# Patient Record
Sex: Female | Born: 1990 | Race: White | Hispanic: No | Marital: Married | Smoking: Never smoker
Health system: Southern US, Community
[De-identification: ages and names within clinical notes are randomized; demographics above are authoritative.]

## PROBLEM LIST (undated history)

## (undated) DIAGNOSIS — F32A Depression, unspecified: Secondary | ICD-10-CM

## (undated) DIAGNOSIS — G90529 Complex regional pain syndrome I of unspecified lower limb: Secondary | ICD-10-CM

## (undated) DIAGNOSIS — F419 Anxiety disorder, unspecified: Secondary | ICD-10-CM

## (undated) DIAGNOSIS — F329 Major depressive disorder, single episode, unspecified: Secondary | ICD-10-CM

## (undated) HISTORY — PX: ABDOMINAL HYSTERECTOMY: SHX81

---
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
## 2020-03-25 ENCOUNTER — Encounter: Payer: Self-pay | Admitting: Emergency Medicine

## 2020-03-25 ENCOUNTER — Other Ambulatory Visit: Payer: Self-pay

## 2020-03-25 ENCOUNTER — Emergency Department (INDEPENDENT_AMBULATORY_CARE_PROVIDER_SITE_OTHER)
Admission: EM | Admit: 2020-03-25 | Discharge: 2020-03-25 | Disposition: A | Discharge status: Home / Self Care | Payer: 59 | Source: Home / Self Care | Attending: Emergency Medicine | Admitting: Emergency Medicine

## 2020-03-25 DIAGNOSIS — J40 Bronchitis, not specified as acute or chronic: Secondary | ICD-10-CM | POA: Diagnosis not present

## 2020-03-25 DIAGNOSIS — J301 Allergic rhinitis due to pollen: Secondary | ICD-10-CM

## 2020-03-25 MED ORDER — AZITHROMYCIN 250 MG PO TABS: ORAL_TABLET | ORAL | 0 refills | Status: AC

## 2020-03-25 MED ORDER — FLUTICASONE PROPIONATE 50 MCG/ACT NA SUSP: NASAL | 0 refills | Status: AC

## 2020-03-25 MED ORDER — PROMETHAZINE-DM 6.25-15 MG/5ML PO SYRP: ORAL_SOLUTION | ORAL | 0 refills | Status: AC

## 2020-03-25 NOTE — ED Triage Notes (Signed)
Patient c/o deep cough, chest congestion, drainage is clear, sore throat x 2 weeks.  Tested for COVID last week, negative, "feels like crap."

## 2020-03-25 NOTE — ED Provider Notes (Signed)
[facility]    CSN: [id number] Arrival date & time: 03/25/20  1107      History   Chief Complaint Chief Complaint  Patient presents with  . URI    HPI Lauren Peck is a 29 y.o. female.   HPI Acute nasal and URI symptoms first started 13 days ago.  Started with allergy symptoms associated with high pollen count and she was very nearby others who were cutting grass.  Started with sinus congestion, clear rhinorrhea, eyes itching, sneezing.  Minimal scratchy throat but no sore throat and the scratchy throat has since resolved.  Then developed mild fever to 100 with chills minimal sweats.  She went to a different urgent care on 4/2, where Covid test was negative.  Urine culture from that visit was ultimately positive, and she was prescribed Macrodantin 2 days ago, and she will be filling that prescription today. Over past several days, cough has worsened, productive of yellow sputum at times. Still has some sinus congestion and left ear feels clogged now. Currently denies any fever.  Denies chest pain or shortness of breath or abdominal pain or nausea or vomiting or change of bowel habits.  Past medical history:  She has seasonal allergies every spring.  No history of chronic lung disease or asthma.   She has chronic pelvic pain, seeing a specialist for this.  Specialist is prescribing vaginal diazepam.  Also on gabapentin and bupropion. No LMP recorded. Patient has had a hysterectomy.   History reviewed. No pertinent surgical history.  Except hysterectomy  OB History   No obstetric history on file.      Home Medications    Prior to Admission medications   Medication Sig Start Date End Date Taking? Authorizing Provider  acetaZOLAMIDE (DIAMOX) 250 MG tablet  03/17/20  Yes [provider]  amitriptyline (ELAVIL) 10 MG tablet Take 10 mg by mouth at bedtime. 03/15/20  Yes [provider]  BELBUCA 300 MCG FILM Take 1 strip by mouth 2 (two) times daily.  02/09/20  Yes [provider]  buPROPion (WELLBUTRIN XL) 150 MG 24 hr tablet TK 1 T PO  D 02/17/17  Yes [provider]  diazepam (VALIUM) 5 MG tablet Take 5 mg by mouth daily as needed. 11/18/19  Yes [provider]  diphenhydrAMINE (SOMINEX) 25 MG tablet Take by mouth.   Yes [provider]  Docusate Sodium (DSS) 100 MG CAPS Take by mouth.   Yes [provider]  DULoxetine (CYMBALTA) 30 MG capsule Take by mouth. 02/22/19  Yes [provider]  Estradiol-Progesterone (BIJUVA) 1-100 MG CAPS Take by mouth. 09/14/18  Yes [provider]  gabapentin (NEURONTIN) 300 MG capsule Take by mouth. 11/18/18  Yes [provider]  linaclotide (LINZESS) 145 MCG CAPS capsule Take by mouth.   Yes [provider]  meclizine (ANTIVERT) 25 MG tablet Take 25 mg by mouth 3 (three) times daily as needed. 03/07/20  Yes [provider]  Meth-Hyo-M Bl-Na Phos-Ph Sal (URIBEL) 118 MG CAPS Take by mouth. 02/09/19  Yes [provider]  nitrofurantoin, macrocrystal-monohydrate, (MACROBID) 100 MG capsule Take 100 mg by mouth 2 (two) times daily. 03/21/20  Yes [provider]  promethazine (PHENERGAN) 25 MG tablet Take by mouth. 11/21/16  Yes [provider]  tiZANidine (ZANAFLEX) 4 MG capsule Take by mouth. 06/15/19  Yes [provider]  traZODone (DESYREL) 50 MG tablet TK 1 TO 2 TS PO HS PRF INSOMNIA 12/25/16  Yes [provider]  azithromycin (ZITHROMAX Z-PAK) 250 MG tablet Take 2 tablets on day one, then 1 tablet daily on days 2 through 5 03/25/20   Lajean Manes, MD  fluticasone [facility]) 50 MCG/ACT nasal spray 1 or 2 sprays each nostril twice a day 03/25/20   Lajean Manes, MD  promethazine-dextromethorphan (PROMETHAZINE-DM) 6.25-15 MG/5ML syrup 1 or 2 teaspoon po every 4-6 hours as needed for cough.-May cause drowsiness 03/25/20   Lajean Manes, MD    Family History No family history on file.  Social  History Social History   Tobacco Use  . Smoking status: Never Smoker  . Smokeless tobacco: Never Used  Substance Use Topics  . Alcohol use: Not on file  . Drug use: Not on file     Allergies   Ciprofloxacin   Review of Systems Review of Systems  All other systems reviewed and are negative.    Physical Exam Triage Vital Signs ED Triage Vitals  Enc Vitals Group     BP 03/25/20 1134 (!) 144/92     Pulse Rate 03/25/20 1134 98     Resp --      Temp 03/25/20 1134 99 F (37.2 C)     Temp Source 03/25/20 1134 Oral     SpO2 03/25/20 1134 97 %     Weight 03/25/20 1135 251 lb 8 oz (114.1 kg)     Height 03/25/20 1135 5\' 7"  (1.702 m)     Head Circumference --      Peak Flow --      Pain Score 03/25/20 1135 0     Pain Loc --      Pain Edu? --      Excl. in GC? --    No data found.  Updated Vital Signs BP (!) 144/92 (BP Location: Left Arm)   Pulse 98   Temp 99 F (37.2 C) (Oral)   Ht 5\' 7"  (1.702 m)   Wt 114.1 kg   SpO2 97%   BMI 39.39 kg/m    Physical Exam Vitals and nursing note reviewed.  Constitutional:      General: She is not in acute distress.    Appearance: She is well-developed.     Comments: Cough noted  HENT:     Right Ear: External ear normal.  TM normal.    Left Ear: External ear normal.  TM normal.   Nose: Boggy turbinates, mild seromucoid drainage bilaterally.  No maxillary sinus tenderness.     Mouth/Throat: Oropharynx within normal limits except cobblestoning posterior pharynx.    Pharynx: No uvula swelling.  Airway intact. Eyes:     General: No scleral icterus.    Conjunctiva/sclera:     Right eye: Right conjunctiva normal.  No discharge.    Left eye: Left conjunctiva is normal.  No discharge.    Pupils: Pupils are equal, round, and reactive to light.  Neck:     Supple, no adenopathy.  No JVD.     Trachea: No tracheal deviation.  Cardiovascular:     Rate and Rhythm: Normal rate and regular rhythm.  Pulmonary:     Effort: No  respiratory distress.     Breath sounds: No stridor.  Diffuse rhonchi heard bilaterally.  Good air movement.  No wheezing.  No rales.  Abdominal:   There is no distension.   Lymphadenopathy:     Cervical: No cervical adenopathy.  Skin:    Findings: No rash.  Neurological:     Cranial Nerves: No cranial nerve deficit.  UC Treatments / Results  Labs (all labs ordered are listed, but only abnormal results are displayed) Labs Reviewed - No data to display  EKG   Radiology No results found.  Procedures Procedures (including critical care time)  Medications Ordered in UC Medications - No data to display  Initial Impression / Assessment and Plan / UC Course  I have reviewed the triage vital signs and the nursing notes.  Pertinent labs & imaging results that were available during my care of the patient were reviewed by me and considered in my medical decision making (see chart for details).    Likely started as flareup of seasonal allergies 13 days ago, but now history and physical consistent with acute bronchitis.  No evidence of pneumonia on physical exam.  We discussed option of chest x-ray, she declined. She had a negative Covid test 8 days ago and we discussed option of repeating the Covid test, but she declined doing Covid test today. After treatment options discussed, will treat as described in discharge instructions. Follow-up with your primary care doctor in 5-7 days if not improving, or sooner if symptoms become worse. Precautions discussed. Red flags discussed. Questions invited and answered. Patient voiced understanding and agreement.   Final Clinical Impressions(s) / UC Diagnoses   Final diagnoses:  Bronchitis  Seasonal allergic rhinitis due to pollen     Discharge Instructions     This illness started as flareup of seasonal allergies 13 days ago, but progressed to acute bronchitis. Treating with Zithromax Z-Pak to cover bacterial causes of  bronchitis.-Based on history and physical exam, no evidence of pneumonia. Please read attached instruction sheets. Follow-up with PCP if not improving in 5 to 7 days, sooner if worse or new symptoms.    Also advised may use Zyrtec OTC as needed seasonal allergy symptoms. Follow-up with your primary care doctor in 5-7 days if not improving, or sooner if symptoms become worse. Precautions discussed. Red flags discussed. Questions invited and answered. Patient voiced understanding and agreement.  ED Prescriptions    Medication Sig Dispense Auth. Provider   azithromycin (ZITHROMAX Z-PAK) 250 MG tablet Take 2 tablets on day one, then 1 tablet daily on days 2 through 5 1 each Jacqulyn Cane, MD   promethazine-dextromethorphan (PROMETHAZINE-DM) 6.25-15 MG/5ML syrup 1 or 2 teaspoon po every 4-6 hours as needed for cough.-May cause drowsiness 118 mL Jacqulyn Cane, MD   fluticasone [facility]) 50 MCG/ACT nasal spray 1 or 2 sprays each nostril twice a day 16 g Jacqulyn Cane, MD     PDMP not reviewed this encounter.   Jacqulyn Cane, MD 03/25/20 1556

## 2020-03-25 NOTE — Discharge Instructions (Addendum)
This illness started as flareup of seasonal allergies 13 days ago, but progressed to acute bronchitis. Treating with Zithromax Z-Pak to cover bacterial causes of bronchitis.-Based on history and physical exam, no evidence of pneumonia. Please read attached instruction sheets. Follow-up with PCP if not improving in 5 to 7 days, sooner if worse or new symptoms.

## 2020-03-30 ENCOUNTER — Emergency Department (HOSPITAL_BASED_OUTPATIENT_CLINIC_OR_DEPARTMENT_OTHER): Payer: 59

## 2020-03-30 ENCOUNTER — Other Ambulatory Visit: Payer: Self-pay

## 2020-03-30 ENCOUNTER — Encounter (HOSPITAL_BASED_OUTPATIENT_CLINIC_OR_DEPARTMENT_OTHER): Payer: Self-pay | Admitting: Emergency Medicine

## 2020-03-30 ENCOUNTER — Emergency Department (HOSPITAL_BASED_OUTPATIENT_CLINIC_OR_DEPARTMENT_OTHER)
Admission: EM | Admit: 2020-03-30 | Discharge: 2020-03-30 | Disposition: A | Discharge status: Home / Self Care | Payer: 59 | Attending: Emergency Medicine | Admitting: Emergency Medicine

## 2020-03-30 ENCOUNTER — Emergency Department (HOSPITAL_COMMUNITY): Payer: 59

## 2020-03-30 DIAGNOSIS — G932 Benign intracranial hypertension: Secondary | ICD-10-CM | POA: Diagnosis not present

## 2020-03-30 DIAGNOSIS — Z79899 Other long term (current) drug therapy: Secondary | ICD-10-CM | POA: Insufficient documentation

## 2020-03-30 DIAGNOSIS — R4701 Aphasia: Secondary | ICD-10-CM

## 2020-03-30 DIAGNOSIS — R200 Anesthesia of skin: Secondary | ICD-10-CM | POA: Diagnosis not present

## 2020-03-30 DIAGNOSIS — R130 Aphagia: Secondary | ICD-10-CM | POA: Diagnosis present

## 2020-03-30 HISTORY — DX: Depression, unspecified: F32.A

## 2020-03-30 HISTORY — DX: Complex regional pain syndrome i of unspecified lower limb: G90.529

## 2020-03-30 HISTORY — DX: Anxiety disorder, unspecified: F41.9

## 2020-03-30 LAB — DIFFERENTIAL
Abs Immature Granulocytes: 0.04 10*3/uL (ref 0.00–0.07)
Basophils Absolute: 0.1 10*3/uL (ref 0.0–0.1)
Basophils Relative: 1 %
Eosinophils Absolute: 0.4 10*3/uL (ref 0.0–0.5)
Eosinophils Relative: 4 %
Immature Granulocytes: 0 %
Lymphocytes Relative: 35 %
Lymphs Abs: 3.5 10*3/uL (ref 0.7–4.0)
Monocytes Absolute: 0.5 10*3/uL (ref 0.1–1.0)
Monocytes Relative: 5 %
Neutro Abs: 5.6 10*3/uL (ref 1.7–7.7)
Neutrophils Relative %: 55 %

## 2020-03-30 LAB — COMPREHENSIVE METABOLIC PANEL
ALT: 26 U/L (ref 0–44)
AST: 30 U/L (ref 15–41)
Albumin: 4.4 g/dL (ref 3.5–5.0)
Alkaline Phosphatase: 75 U/L (ref 38–126)
Anion gap: 10 (ref 5–15)
BUN: 13 mg/dL (ref 6–20)
CO2: 24 mmol/L (ref 22–32)
Calcium: 9.2 mg/dL (ref 8.9–10.3)
Chloride: 105 mmol/L (ref 98–111)
Creatinine, Ser: 1.04 mg/dL — ABNORMAL HIGH (ref 0.44–1.00)
GFR calc Af Amer: >60 mL/min (ref >60)
GFR calc non Af Amer: >60 mL/min (ref >60)
Glucose, Bld: 93 mg/dL (ref 70–99)
Potassium: 3.6 mmol/L (ref 3.5–5.1)
Sodium: 139 mmol/L (ref 135–145)
Total Bilirubin: 0.3 mg/dL (ref 0.3–1.2)
Total Protein: 8 g/dL (ref 6.5–8.1)

## 2020-03-30 LAB — CBC
HCT: 44.4 % (ref 36.0–46.0)
Hemoglobin: 13.7 g/dL (ref 12.0–15.0)
MCH: 25.3 pg — ABNORMAL LOW (ref 26.0–34.0)
MCHC: 30.9 g/dL (ref 30.0–36.0)
MCV: 82.1 fL (ref 80.0–100.0)
Platelets: 273 10*3/uL (ref 150–400)
RBC: 5.41 MIL/uL — ABNORMAL HIGH (ref 3.87–5.11)
RDW: 13.2 % (ref 11.5–15.5)
WBC: 10.1 10*3/uL (ref 4.0–10.5)
nRBC: 0 % (ref 0.0–0.2)

## 2020-03-30 LAB — PROTIME-INR
INR: 1 (ref 0.8–1.2)
Prothrombin Time: 12.6 seconds (ref 11.4–15.2)

## 2020-03-30 LAB — CBG MONITORING, ED: Glucose-Capillary: 86 mg/dL (ref 70–99)

## 2020-03-30 LAB — APTT: aPTT: 33 seconds (ref 24–36)

## 2020-03-30 LAB — PREGNANCY, URINE: Preg Test, Ur: NEGATIVE

## 2020-03-30 MED ORDER — ONDANSETRON HCL 4 MG/2ML IJ SOLN
4.0000 mg | Freq: Once | INTRAMUSCULAR | Status: AC
  Administered 2020-03-30: 22:00:00 4 mg via INTRAVENOUS

## 2020-03-30 MED ORDER — GADOBUTROL 1 MMOL/ML IV SOLN
10.0000 mL | Freq: Once | INTRAVENOUS | Status: AC | PRN
  Administered 2020-03-30: 21:00:00 10 mL via INTRAVENOUS

## 2020-03-30 MED ORDER — OXYCODONE-ACETAMINOPHEN 5-325 MG PO TABS
2.0000 | ORAL_TABLET | Freq: Once | ORAL | Status: AC
  Administered 2020-03-30: 2 via ORAL
  Filled 2020-03-30: qty 2

## 2020-03-30 NOTE — ED Provider Notes (Signed)
[facility] EMERGENCY DEPARTMENT Provider Note   CSN: [id number] Arrival date & time: 03/30/20  1336     History Chief Complaint  Patient presents with  . Aphasia    Lauren Peck is a 29 y.o. female.  Patient followed by neurology at [facility].  Last seen by them on December 17.  However they had planned an outpatient MRI.  Also they made a referral for to see be seen in the emergency department on April 12 and patient went to [place].  But the weights were too long so she did not get seen in the left.  Her neurologist scheduled an outpatient MRI on April 13.  Patient states that on Tuesday started with tingling on both sides of her face.  And legs.  She has had the tingling on the face in the past according to neurology notes from December 17.  And she had difficulty finding words.  Today also had difficulty finding words.  And just and can seem to find the right word.  Did have some tingling in the face.  Had some tingling in the arms today.  Patient is also followed by [facility] she has pain syndrome chronic.  For chronic pelvic pain.  According to her neurology notes from December she has had an EEG which was negative MRIs in the past that have been negative she had an LP which showed an increased intracranial pressure.  She has a diagnosis of idiopathic intracranial hypertension.  She is on Diamox for that.  And she has had tingling on the right side of her face and headache problems in the past.  Her neurologist is Dr. Chauncey Cruel at [facility] in [place].  Patient symptoms have been intermittent.        Past Medical History:  Diagnosis Date  . Anxiety   . 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 Depression     There are no problems to display for this patient.   Past Surgical History:  Procedure Laterality Date  . ABDOMINAL HYSTERECTOMY       OB History   No obstetric history on file.     No family history on  file.  Social History   Tobacco Use  . Smoking status: Never Smoker  . Smokeless tobacco: Never Used  Substance Use Topics  . Alcohol use: Not Currently  . Drug use: Not on file    Home Medications Prior to Admission medications   Medication Sig Start Date End Date Taking? Authorizing Provider  acetaZOLAMIDE (DIAMOX) 250 MG tablet  03/17/20   [provider]  amitriptyline (ELAVIL) 10 MG tablet Take 10 mg by mouth at bedtime. 03/15/20   [provider]  azithromycin (ZITHROMAX Z-PAK) 250 MG tablet Take 2 tablets on day one, then 1 tablet daily on days 2 through 5 03/25/20   Lajean Manes, MD  BELBUCA 300 MCG FILM Take 1 strip by mouth 2 (two) times daily. 02/09/20   [provider]  buPROPion (WELLBUTRIN XL) 150 MG 24 hr tablet TK 1 T PO  D 02/17/17   [provider]  diazepam (VALIUM) 5 MG tablet Take 5 mg by mouth daily as needed. 11/18/19   [provider]  diphenhydrAMINE (SOMINEX) 25 MG tablet Take by mouth.    [provider]  Docusate Sodium (DSS) 100 MG CAPS Take by mouth.    [provider]  DULoxetine (CYMBALTA) 30 MG capsule Take by mouth.  02/22/19   [provider]  Estradiol-Progesterone (BIJUVA) 1-100 MG CAPS Take by mouth. 09/14/18   [provider]  fluticasone [facility]) 50 MCG/ACT nasal spray 1 or 2 sprays each nostril twice a day 03/25/20   Jacqulyn Cane, MD  gabapentin (NEURONTIN) 300 MG capsule Take by mouth. 11/18/18   [provider]  linaclotide Rolan Lipa) 145 MCG CAPS capsule Take by mouth.    [provider]  meclizine (ANTIVERT) 25 MG tablet Take 25 mg by mouth 3 (three) times daily as needed. 03/07/20   [provider]  Meth-Hyo-M Bl-Na Phos-Ph Sal (URIBEL) 118 MG CAPS Take by mouth. 02/09/19   [provider]  nitrofurantoin, macrocrystal-monohydrate, (MACROBID) 100 MG capsule Take 100 mg by mouth 2 (two) times daily. 03/21/20   [provider]    promethazine (PHENERGAN) 25 MG tablet Take by mouth. 11/21/16   [provider]  promethazine-dextromethorphan (PROMETHAZINE-DM) 6.25-15 MG/5ML syrup 1 or 2 teaspoon po every 4-6 hours as needed for cough.-May cause drowsiness 03/25/20   Jacqulyn Cane, MD  tiZANidine (ZANAFLEX) 4 MG capsule Take by mouth. 06/15/19   [provider]  traZODone (DESYREL) 50 MG tablet TK 1 TO 2 TS PO HS PRF INSOMNIA 12/25/16   [provider]    Allergies    Ciprofloxacin  Review of Systems   Review of Systems  Constitutional: Negative for chills and fever.  HENT: Negative for rhinorrhea and sore throat.   Eyes: Negative for visual disturbance.  Respiratory: Negative for cough and shortness of breath.   Cardiovascular: Negative for chest pain and leg swelling.  Gastrointestinal: Negative for abdominal pain, diarrhea, nausea and vomiting.  Genitourinary: Negative for dysuria.  Musculoskeletal: Negative for back pain and neck pain.  Skin: Negative for rash.  Neurological: Positive for dizziness, speech difficulty and numbness. Negative for light-headedness and headaches.  Hematological: Does not bruise/bleed easily.  Psychiatric/Behavioral: Negative for confusion.    Physical Exam Updated Vital Signs BP 124/77   Pulse (!) 107   Temp 99.2 F (37.3 C) (Oral)   Resp (!) 23   Ht 1.702 m (5\' 7" )   Wt 113.4 kg   SpO2 97%   BMI 39.16 kg/m   Physical Exam Vitals and nursing note reviewed.  Constitutional:      General: She is not in acute distress.    Appearance: Normal appearance. She is well-developed.  HENT:     Head: Normocephalic and atraumatic.  Eyes:     Extraocular Movements: Extraocular movements intact.     Conjunctiva/sclera: Conjunctivae normal.     Pupils: Pupils are equal, round, and reactive to light.  Cardiovascular:     Rate and Rhythm: Normal rate and regular rhythm.     Heart sounds: No murmur.  Pulmonary:     Effort: Pulmonary effort is normal. No  respiratory distress.     Breath sounds: Normal breath sounds.  Abdominal:     Palpations: Abdomen is soft.     Tenderness: There is no abdominal tenderness.  Musculoskeletal:        General: Normal range of motion.     Cervical back: Normal range of motion and neck supple.  Skin:    General: Skin is warm and dry.     Capillary Refill: Capillary refill takes less than 2 seconds.  Neurological:     General: No focal deficit present.     Mental Status: She is alert and oriented to person, place, and time.     Cranial Nerves:  No cranial nerve deficit.     Sensory: No sensory deficit.     Motor: No weakness.     Coordination: Coordination normal.     ED Results / Procedures / Treatments   Labs (all labs ordered are listed, but only abnormal results are displayed) Labs Reviewed  CBC - Abnormal; Notable for the following components:      Result Value   RBC 5.41 (*)    MCH 25.3 (*)    All other components within normal limits  COMPREHENSIVE METABOLIC PANEL - Abnormal; Notable for the following components:   Creatinine, Ser 1.04 (*)    All other components within normal limits  PROTIME-INR  APTT  DIFFERENTIAL  PREGNANCY, URINE  CBG MONITORING, ED    EKG EKG Interpretation  Date/Time:  Thursday March 30 2020 14:13:17 EDT Ventricular Rate:  107 PR Interval:    QRS Duration: 93 QT Interval:  345 QTC Calculation: 461 R Axis:   81 Text Interpretation: Sinus tachycardia Borderline repolarization abnormality No previous ECGs available Confirmed by Frederick Peers [phone]) on 03/30/2020 2:17:57 PM Also confirmed by Vanetta Mulders [phone])  on 03/30/2020 4:35:04 PM   Radiology CT HEAD WO CONTRAST  Result Date: 03/30/2020 CLINICAL DATA:  Headache and dizziness. Intermittent expressive aphasia EXAM: CT HEAD WITHOUT CONTRAST TECHNIQUE: Contiguous axial images were obtained from the base of the skull through the vertex without intravenous contrast. COMPARISON:  None. FINDINGS: Brain:  Ventricles and sulci are normal in size and configuration. There is no intracranial mass, hemorrhage, extra-axial fluid collection, or midline shift. Brain parenchyma appears unremarkable. No evident acute infarct. Vascular: No hyperdense vessel.  No evident vascular calcification. Skull: The bony calvarium appears intact. Sinuses/Orbits: Visualized paranasal sinuses are clear. Visualized orbits appear symmetric bilaterally. Other: Mastoid air cells are clear. IMPRESSION: Study within normal limits. Electronically Signed   By: Bretta Bang III M.D.   On: 03/30/2020 15:07    Procedures Procedures (including critical care time)  Medications Ordered in ED Medications - No data to display  ED Course  I have reviewed the triage vital signs and the nursing notes.  Pertinent labs & imaging results that were available during my care of the patient were reviewed by me and considered in my medical decision making (see chart for details).    MDM Rules/Calculators/A&P                     Patient symptoms that she is presenting with today have and identified by her neurologist in the past.  Her neurologist is Chauncey Cruel at [facility] in [place].  He had arranged an outpatient MRI.  The patient arrived here today because of the new findings of the transient aphasia described as difficulty finding words.  She had facial numbness before that is ongoing intermittently now and this time she does have some numbness in her lower extremities intermittently but today it was more the upper extremities.  Patient has a history of idiopathic intracranial hypertension.  She is on Diamox for that.  She has had EEGs in December that were negative she has had MRIs in the past that had no acute findings.  She had an LP that showed increased intracranial pressure.  She has had trouble with headaches in the past.  Some of patient's findings seem to be consistent with what is been going on and what she is followed by  neurology for.  But the aphasia is new.  Patient wants to get MRI today.  Did state that she could probably contact her neurologist and maybe he could arrange an outpatient MRI but patient wants to be transferred into [facility] for MRI.  Patient understands that the MRI is negative she will be discharged back to follow-up with her neurologist.  In addition patient was requesting something for pain.  But told her she is followed by pain management and we would not be able to prescribe any narcotics.  Patient be transferred in the [facility] MRI has been ordered.  Patient has no restrictions against MRI.  Discussed with Dr.Yoa  in the yellow pod.  Final Clinical Impression(s) / ED Diagnoses Final diagnoses:  Aphasia  IIH (idiopathic intracranial hypertension)    Rx / DC Orders ED Discharge Orders    None       Vanetta Mulders, MD 03/30/20 1755

## 2020-03-30 NOTE — ED Notes (Signed)
ED Provider at bedside. 

## 2020-03-30 NOTE — ED Notes (Signed)
EDP notified of pt's symptoms 

## 2020-03-30 NOTE — ED Notes (Signed)
Patient verbalizes understanding of discharge instructions. Opportunity for questioning and answers were provided. Armband removed by staff, pt discharged from ED.  

## 2020-03-30 NOTE — ED Notes (Signed)
Attempted IV to Left hand and LAC, tol well but unsuccessful

## 2020-03-30 NOTE — Discharge Instructions (Signed)
You were seen in the emergency department for evaluation of tingling in your face weakness in your arms and legs and some difficulty in word finding.  Your blood work was unremarkable.  Your MRI also did not show any acute findings.  It will be important for you to follow-up with your neurologist for further work-up.  Included below is the report from the MRI to help your doctors.  IMPRESSION:  1. No evidence of acute intracranial abnormality.  2. Partially empty sella turcica. This is very commonly an  incidental finding, but may be seen in the setting of idiopathic  intracranial hypertension.  3. Otherwise unremarkable MRI appearance of the brain.  4. Mild ethmoid and maxillary sinus mucosal thickening.  5. Small left mastoid effusion.

## 2020-03-30 NOTE — ED Triage Notes (Addendum)
Pt reports an episode of expressive aphasia today around 12:30. Pt had an episode on 4/12, was taken to [facility] but eloped. Pt CAO x4 at present, BEFAST screen neg., c/o generalized weakness

## 2020-03-30 NOTE — ED Notes (Signed)
Report given to [facility] RN at [facility] ED

## 2020-03-30 NOTE — ED Provider Notes (Signed)
29 year old female transfer from med [facility] for MRI.  She has had tingling in both sides of her face for 2 days.  Feeling generally weak all over.  Also having word difficulty finding.  Symptoms of been intermittent.  She has had outpatient neurology with [facility].  She is also had an EEG and negative MRIs in the past.  She carries a diagnosis of idiopathic intracranial hypertension and is on Diamox for that.  She also is complaining of pelvic pain which she relates to her regional chronic pain syndrome and is on narcotics for this. Physical Exam  BP 135/70   Pulse (!) 112   Temp 99.6 F (37.6 C) (Oral)   Resp 16   Ht 5\' 7"  (1.702 m)   Wt 113.4 kg   SpO2 97%   BMI 39.16 kg/m   Physical Exam  ED Course/Procedures     Procedures  MDM   Patient's initial labs have been unremarkable.  She has an MRI that is ordered.  Pharmacy was not able to provide her outpatient pain medicine and so we will give her a couple Percocet.  Disposition per results of testing.  Patient's MRI did not show any acute findings.  They did comment upon partial empty sella.  I reviewed this with the patient and recommended that she follow-up with her neurologist for further evaluation.  Patient comfortable with plan.      , MD 03/31/20 1000

## 2020-03-30 NOTE — ED Notes (Signed)
Report given to Lindsey RN with [facility]  

## 2020-03-30 NOTE — ED Notes (Signed)
Pt states she has been having a dull HA, occasional dizziness that is positional, intermittent heaviness in bilateral extremities and 2 periods of expressive aphasia. All symptoms are waxing and waning for the past 2 days.

## 2020-05-14 IMAGING — CT CT HEAD W/O CM
3 series · 16 of 47 positions shown, 19 images · non-contrast
Comparison: None.

CLINICAL DATA: Headache and dizziness. Intermittent expressive
aphasia

EXAM:
CT HEAD WITHOUT CONTRAST
TECHNIQUE: Contiguous axial images were obtained from the base of the skull
through the vertex without intravenous contrast.

[Series 2: head wo · axial · 0.44mm/px · z∈[+1056,+1180]mm · 10 of 30 slices shown, 13 images]
[im 3/30  brain]
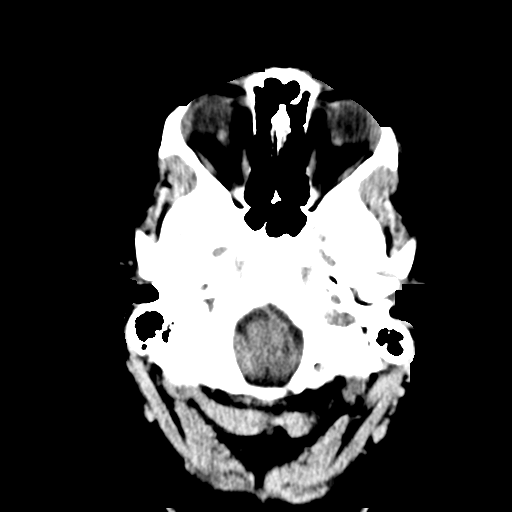
[im 3/30  bone]
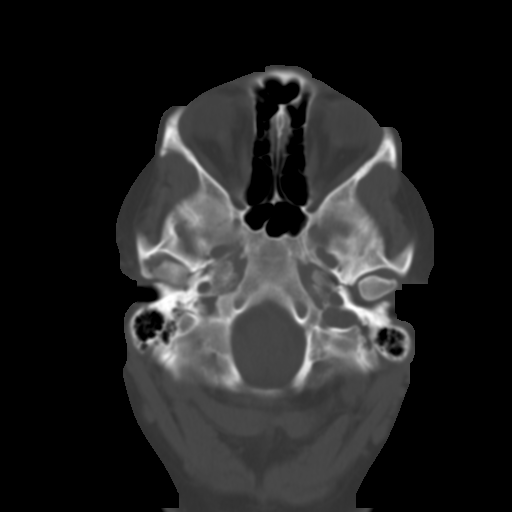
[im 6/30  brain]
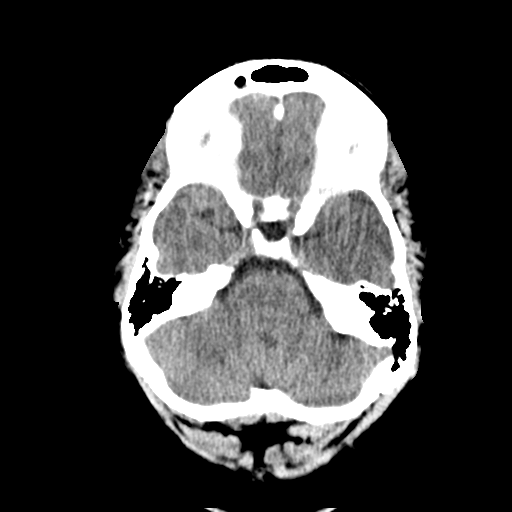
[im 9/30  brain]
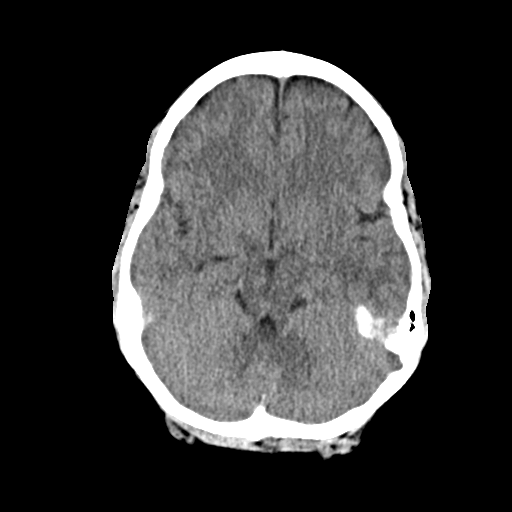
[im 11/30  brain]
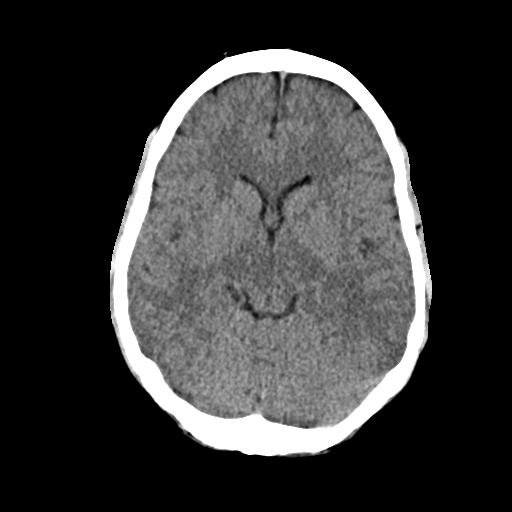
[im 14/30  brain]
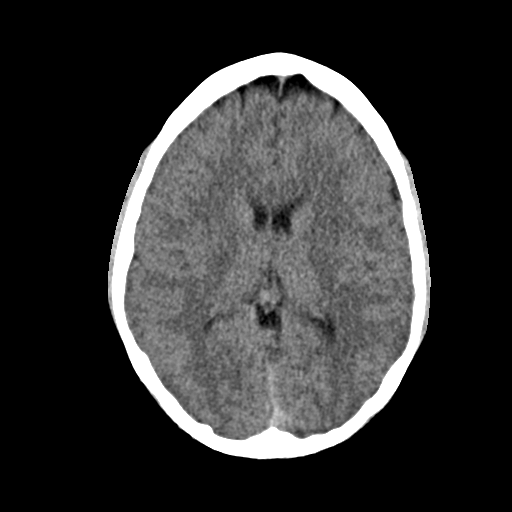
[im 14/30  bone]
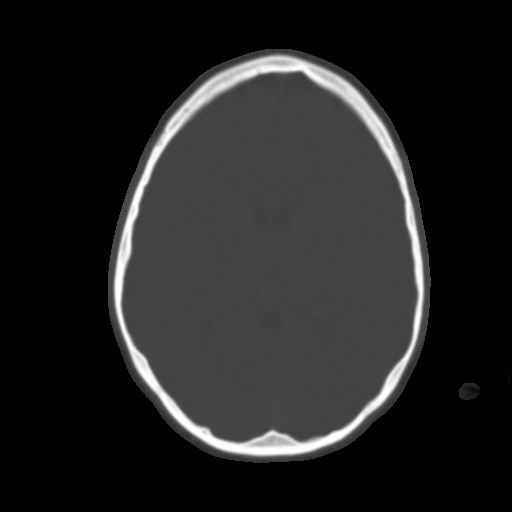
[im 17/30  brain]
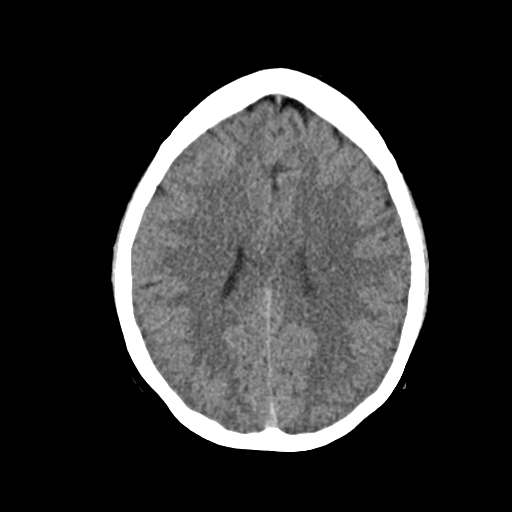
[im 20/30  brain]
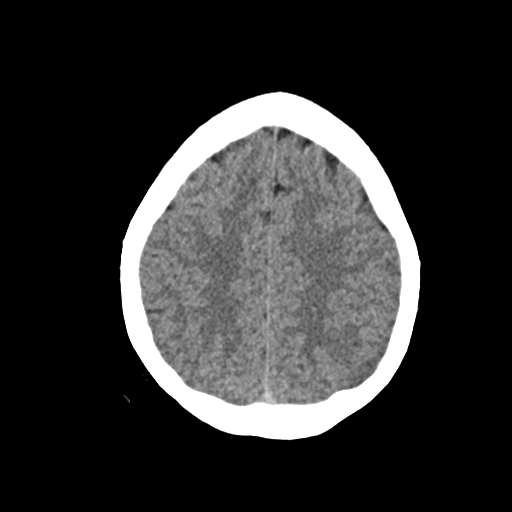
[im 23/30  brain]
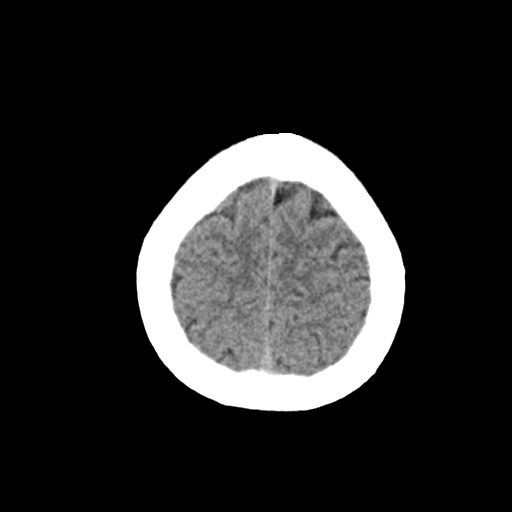
[im 25/30  brain]
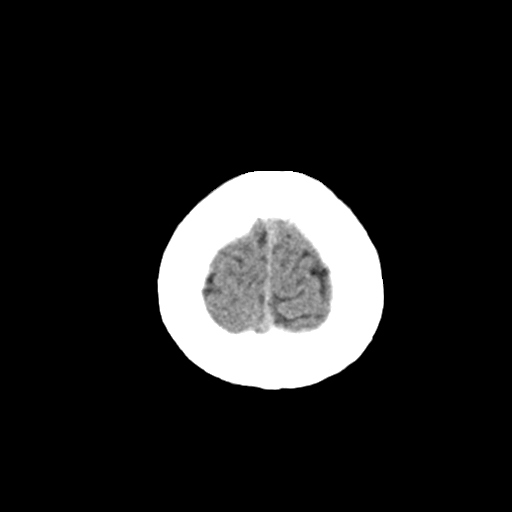
[im 25/30  bone]
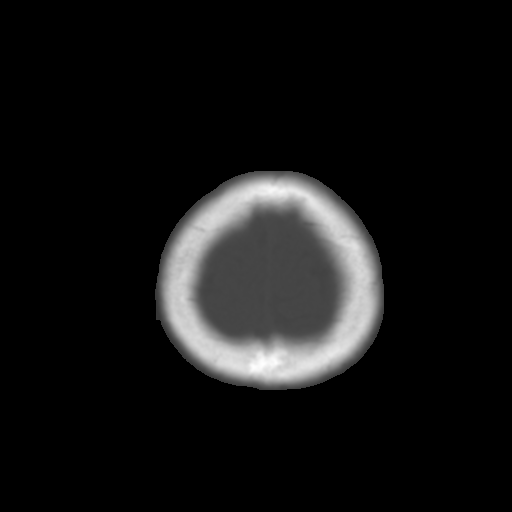
[im 28/30  brain]
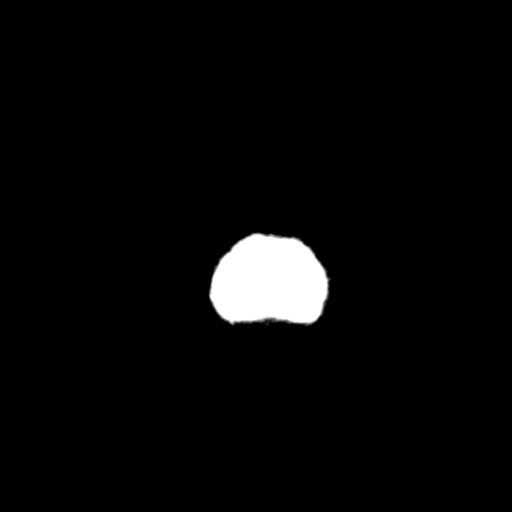

[Series 4: coronal soft · coronal · 0.29mm/px · 3 of 64 slices shown]
[im 22/64  brain]
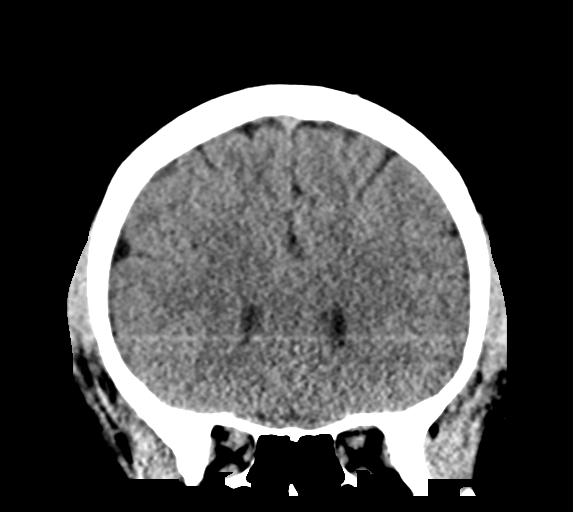
[im 29/64  brain]
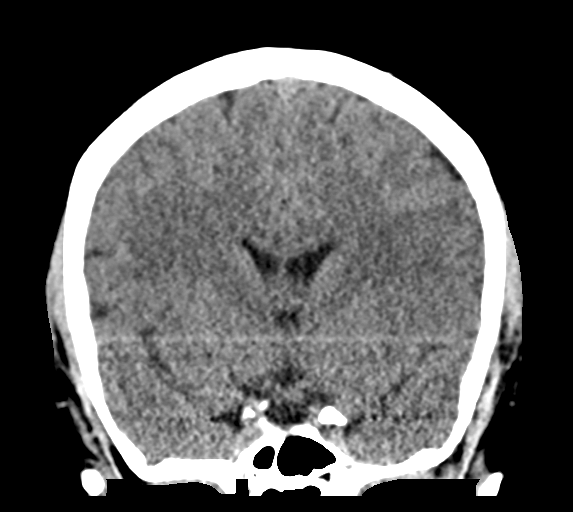
[im 36/64  brain]
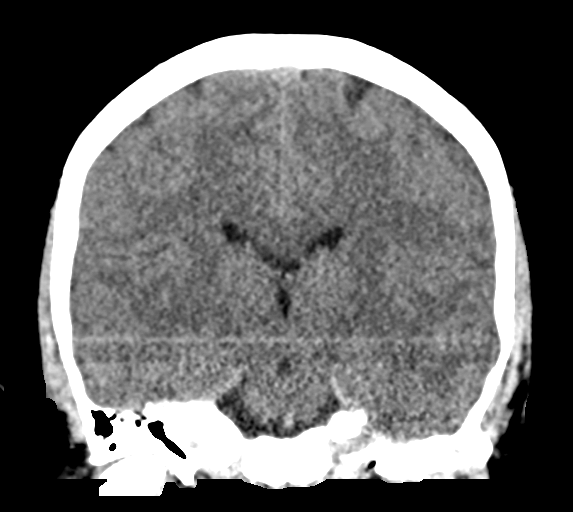

[Series 5: sag soft · sagittal · 0.31mm/px · 3 of 54 slices shown]
[im 18/54  brain]
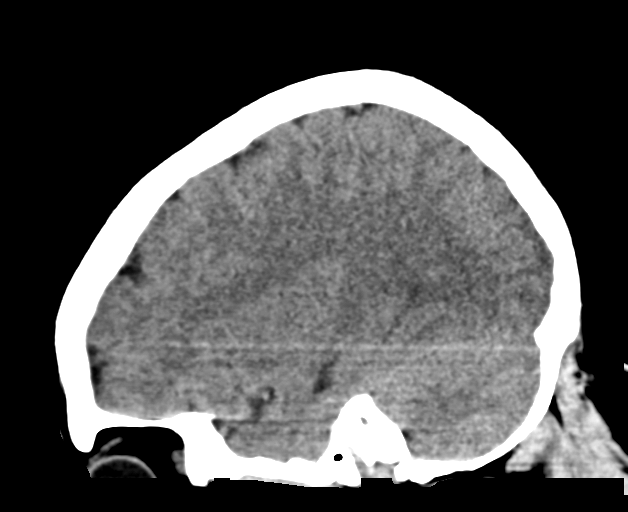
[im 27/54  brain]
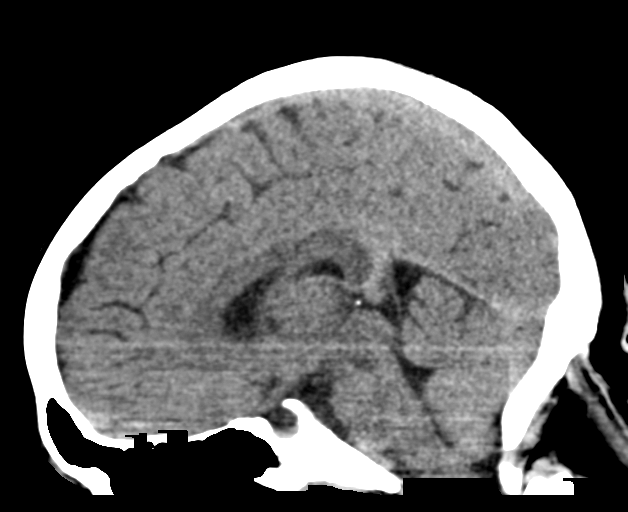
[im 36/54  brain]
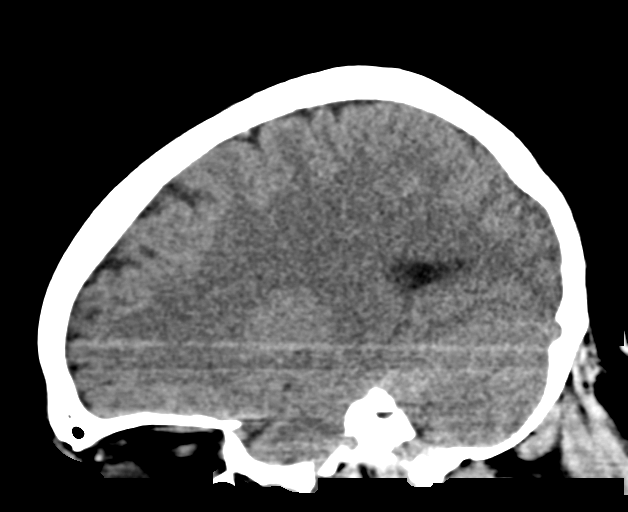

[16 of 47 positions shown; findings below may reference images not displayed]

FINDINGS: Brain: Ventricles and sulci are normal in size and configuration.
There is no intracranial mass, hemorrhage, extra-axial fluid
collection, or midline shift. Brain parenchyma appears unremarkable.
No evident acute infarct.

Vascular: No hyperdense vessel.  No evident vascular calcification.

Skull: The bony calvarium appears intact.

Sinuses/Orbits: Visualized paranasal sinuses are clear. Visualized
orbits appear symmetric bilaterally.

Other: Mastoid air cells are clear.
IMPRESSION: Study within normal limits.

## 2020-06-04 ENCOUNTER — Emergency Department (INDEPENDENT_AMBULATORY_CARE_PROVIDER_SITE_OTHER)
Admission: EM | Admit: 2020-06-04 | Discharge: 2020-06-04 | Disposition: A | Discharge status: Home / Self Care | Payer: 59 | Source: Home / Self Care | Attending: Family Medicine | Admitting: Family Medicine

## 2020-06-04 ENCOUNTER — Other Ambulatory Visit: Payer: Self-pay

## 2020-06-04 DIAGNOSIS — J029 Acute pharyngitis, unspecified: Secondary | ICD-10-CM

## 2020-06-04 LAB — POCT RAPID STREP A (OFFICE): Rapid Strep A Screen: NEGATIVE

## 2020-06-04 LAB — POCT MONO SCREEN (KUC): Mono, POC: NEGATIVE

## 2020-06-04 MED ORDER — PENICILLIN V POTASSIUM 500 MG PO TABS: ORAL_TABLET | ORAL | 0 refills | Status: AC

## 2020-06-04 NOTE — ED Triage Notes (Addendum)
Pt c/o sore throat since Friday night. Also has extreme fatigue. No longer has tonsils. Fever between 99.0 and 100. Tylenol prn. Hx of mono and strep.

## 2020-06-04 NOTE — ED Provider Notes (Signed)
[name]    CSN: [id number] Arrival date & time: 06/04/20  1138      History   Chief Complaint Chief Complaint  Patient presents with  . Sore Throat    HPI Lauren Peck is a 29 y.o. female.   Patient reports that she has been fatigued for about a week, and developed a sore throat three days ago.  She has had persistent fever but denies URI symptoms of cough and nasal congestion.  Her symptoms have not improved with Tylenol.     The history is provided by the patient.    Past Medical History:  Diagnosis Date  . Anxiety   . 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 Depression     There are no problems to display for this patient.   Past Surgical History:  Procedure Laterality Date  . ABDOMINAL HYSTERECTOMY      OB History   No obstetric history on file.      Home Medications    Prior to Admission medications   Medication Sig Start Date End Date Taking? Authorizing Provider  acetaZOLAMIDE (DIAMOX) 250 MG tablet Take 500 mg by mouth at bedtime.  03/17/20   [provider]  amitriptyline (ELAVIL) 10 MG tablet Take 10 mg by mouth at bedtime. 03/15/20   [provider]  azithromycin (ZITHROMAX Z-PAK) 250 MG tablet Take 2 tablets on day one, then 1 tablet daily on days 2 through 5 Patient taking differently: Take 250-500 mg by mouth See admin instructions. Take 2 tablets on day one, then 1 tablet daily on days 2 through 5 03/25/20   Jacqulyn Cane, MD  BELBUCA 450 MCG FILM Take 450 mcg by mouth 2 (two) times daily. 03/27/20   [provider]  buPROPion HCl ER, XL, 450 MG TB24 Take 450 mg by mouth at bedtime. 03/10/20   [provider]  diazepam (VALIUM) 5 MG tablet Take 5 mg by mouth See admin instructions. Before MRI 11/18/19   [provider]  diphenhydrAMINE (SOMINEX) 25 MG tablet Take 25 mg by mouth as needed for allergies.     [provider]  Docusate Sodium (DSS)  100 MG CAPS Take 100 mg by mouth as needed (contipation).     [provider]  DULoxetine (CYMBALTA) 30 MG capsule Take 60 mg by mouth at bedtime.  02/22/19   [provider]  Estradiol-Progesterone (BIJUVA) 1-100 MG CAPS Take 1 tablet by mouth at bedtime.  09/14/18   [provider]  fluticasone Asencion Islam) 50 MCG/ACT nasal spray 1 or 2 sprays each nostril twice a day Patient not taking: Reported on 03/30/2020 03/25/20   Jacqulyn Cane, MD  gabapentin (NEURONTIN) 600 MG tablet Take 600 mg by mouth 3 (three) times daily. 03/13/20   [provider]  meclizine (ANTIVERT) 25 MG tablet Take 25 mg by mouth as needed for dizziness.  03/07/20   [provider]  Meth-Hyo-M Bl-Na Phos-Ph Sal (URIBEL) 118 MG CAPS Take 118 mg by mouth 3 (three) times daily.  02/09/19   [provider]  nitrofurantoin, macrocrystal-monohydrate, (MACROBID) 100 MG capsule Take 100 mg by mouth 2 (two) times daily. 03/21/20   [provider]  NONFORMULARY OR COMPOUNDED ITEM Place 1 each vaginally as needed (pelvic pain). Diazepam 5mg  suppository/Ketamine 10mg  suppository/Baclofen 10mg  suppository compound    [provider]  oxyCODONE-acetaminophen (PERCOCET) 7.5-325 MG tablet Take 1 tablet by mouth 2 (two) times daily as needed. 05/17/20  [provider]  penicillin v potassium (VEETID) 500 MG tablet Take one tab by mouth twice daily for 10 days 06/04/20   Lattie Haw, MD  promethazine (PHENERGAN) 25 MG tablet Take 25 mg by mouth as needed for nausea or vomiting.  11/21/16   [provider]  promethazine-dextromethorphan (PROMETHAZINE-DM) 6.25-15 MG/5ML syrup 1 or 2 teaspoon po every 4-6 hours as needed for cough.-May cause drowsiness Patient not taking: Reported on 03/30/2020 03/25/20   Lajean Manes, MD  tiZANidine (ZANAFLEX) 4 MG capsule Take 4 mg by mouth 3 (three) times daily as needed for muscle spasms.  06/15/19   [provider]  traZODone  (DESYREL) 50 MG tablet Take 50-100 mg by mouth at bedtime as needed (insomnia).  12/25/16   [provider]    Family History History reviewed. No pertinent family history.  Social History Social History   Tobacco Use  . Smoking status: Never Smoker  . Smokeless tobacco: Never Used  Substance Use Topics  . Alcohol use: Not Currently  . Drug use: Not on file     Allergies   Ciprofloxacin and Amitriptyline   Review of Systems Review of Systems + sore throat No cough No pleuritic pain No wheezing No nasal congestion No post-nasal drainage No sinus pain/pressure No itchy/red eyes No earache No hemoptysis No SOB + fever/+ chills No nausea No vomiting No abdominal pain No diarrhea No urinary symptoms No skin rash + fatigue No myalgias No headache   Physical Exam Triage Vital Signs ED Triage Vitals  Enc Vitals Group     BP 06/04/20 1235 127/85     Pulse Rate 06/04/20 1235 (!) 104     Resp 06/04/20 1235 18     Temp 06/04/20 1235 98.3 F (36.8 C)     Temp Source 06/04/20 1235 Oral     SpO2 06/04/20 1235 97 %     Weight --      Height --      Head Circumference --      Peak Flow --      Pain Score 06/04/20 1236 6     Pain Loc --      Pain Edu? --      Excl. in GC? --    No data found.  Updated Vital Signs BP 127/85 (BP Location: Right Arm)   Pulse (!) 104   Temp 98.3 F (36.8 C) (Oral)   Resp 18   SpO2 97%   Visual Acuity Right Eye Distance:   Left Eye Distance:   Bilateral Distance:    Right Eye Near:   Left Eye Near:    Bilateral Near:     Physical Exam Nursing notes and Vital Signs reviewed. Appearance:  Patient appears stated age, and in no acute distress Eyes:  Pupils are equal, round, and reactive to light and accomodation.  Extraocular movement is intact.  Conjunctivae are not inflamed  Ears:  Canals normal.  Tympanic membranes normal.  Nose:  Normal turbinates.  No sinus tenderness.  Pharynx:  Erythematous Neck:   Supple.  Mildly enlarged lateral nodes are present, tender to palpation on the left.  Distinctly tender/enlarged tonsillar nodes bilaterally Lungs:  Clear to auscultation.  Breath sounds are equal.  Moving air well. Heart:  Regular rate and rhythm without murmurs, rubs, or gallops.  Abdomen:  Nontender without masses or hepatosplenomegaly.  Bowel sounds are present.  No CVA or flank tenderness.  Extremities:  No edema.  Skin:  No rash present.  UC Treatments / Results  Labs (all labs ordered are listed, but only abnormal results are displayed) Labs Reviewed  STREP A DNA PROBE  POCT RAPID STREP A (OFFICE) negative  POCT MONO SCREEN ([facility]) negative    EKG   Radiology No results found.  Procedures Procedures (including critical care time)  Medications Ordered in UC Medications - No data to display  Initial Impression / Assessment and Plan / UC Course  I have reviewed the triage vital signs and the nursing notes.  Pertinent labs & imaging results that were available during my care of the patient were reviewed by me and considered in my medical decision making (see chart for details).    CENTOR 4.  ?false negative rapid strep test.  Begin penvk Followup with Family Doctor if not improved in about 8 to 10 days.   Final Clinical Impressions(s) / UC Diagnoses   Final diagnoses:  Acute pharyngitis, unspecified etiology     Discharge Instructions     May take Ibuprofen 200mg , 4 tabs every 8 hours with food.  Try warm salt water gargles for sore throat.     ED Prescriptions    Medication Sig Dispense Auth. Provider   penicillin v potassium (VEETID) 500 MG tablet Take one tab by mouth twice daily for 10 days 20 tablet , MD        Lattie Haw, MD 06/10/20 1050

## 2020-06-04 NOTE — Discharge Instructions (Addendum)
May take Ibuprofen 200mg , 4 tabs every 8 hours with food.  Try warm salt water gargles for sore throat.

## 2020-06-05 LAB — STREP A DNA PROBE: Group A Strep Probe: NOT DETECTED
